# Patient Record
Sex: Female | Born: 2005 | Hispanic: Yes | Smoking: Never smoker
Health system: Southern US, Community
[De-identification: ages and names within clinical notes are randomized; demographics above are authoritative.]

## PROBLEM LIST (undated history)

## (undated) DIAGNOSIS — B079 Viral wart, unspecified: Secondary | ICD-10-CM

## (undated) DIAGNOSIS — K3580 Unspecified acute appendicitis: Secondary | ICD-10-CM

## (undated) DIAGNOSIS — K829 Disease of gallbladder, unspecified: Secondary | ICD-10-CM

## (undated) HISTORY — DX: Unspecified acute appendicitis: K35.80

## (undated) HISTORY — DX: Disease of gallbladder, unspecified: K82.9

## (undated) HISTORY — DX: Viral wart, unspecified: B07.9

---
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
## 2023-02-07 ENCOUNTER — Ambulatory Visit: Payer: Self-pay | Admitting: Family Medicine

## 2023-02-07 ENCOUNTER — Encounter: Payer: Self-pay | Admitting: Family Medicine

## 2023-02-07 VITALS — BP 113/64 | HR 85 | Ht 61.0 in | Wt 134.2 lb

## 2023-02-07 DIAGNOSIS — Z3401 Encounter for supervision of normal first pregnancy, first trimester: Secondary | ICD-10-CM

## 2023-02-07 DIAGNOSIS — Z3009 Encounter for other general counseling and advice on contraception: Secondary | ICD-10-CM

## 2023-02-07 DIAGNOSIS — Z3200 Encounter for pregnancy test, result unknown: Secondary | ICD-10-CM

## 2023-02-07 LAB — PREGNANCY, URINE: Preg Test, Ur: POSITIVE — AB

## 2023-02-07 MED ORDER — PRENATAL VITAMIN 27-0.8 MG PO TABS: 1.0000 | ORAL_TABLET | ORAL | Status: AC

## 2023-02-07 NOTE — Progress Notes (Signed)
Pt was scheduled for FP visit, however she stated that she is pregnant.  UPT positive.  LMP - 11/23/2022.  Positive PT packet given.  The patient was dispensed Prenatal vitamins #100 pills today. I provided counseling today regarding the medication. We discussed the medication, the side effects and when to call clinic. Patient given the opportunity to ask questions. Questions answered.  Pt encouraged to make new OB appointment with [facility] or another provider.  Berdie Ogren, RN

## 2023-03-13 ENCOUNTER — Encounter: Payer: Self-pay | Admitting: Advanced Practice Midwife

## 2023-03-13 ENCOUNTER — Ambulatory Visit: Payer: Medicaid Other | Admitting: Advanced Practice Midwife

## 2023-03-13 VITALS — BP 104/64 | HR 85 | Temp 98.4°F | Wt 133.2 lb

## 2023-03-13 DIAGNOSIS — O0991 Supervision of high risk pregnancy, unspecified, first trimester: Secondary | ICD-10-CM

## 2023-03-13 DIAGNOSIS — O0992 Supervision of high risk pregnancy, unspecified, second trimester: Secondary | ICD-10-CM | POA: Diagnosis not present

## 2023-03-13 DIAGNOSIS — Z3480 Encounter for supervision of other normal pregnancy, unspecified trimester: Secondary | ICD-10-CM | POA: Insufficient documentation

## 2023-03-13 DIAGNOSIS — Z3400 Encounter for supervision of normal first pregnancy, unspecified trimester: Secondary | ICD-10-CM | POA: Insufficient documentation

## 2023-03-13 DIAGNOSIS — Z23 Encounter for immunization: Secondary | ICD-10-CM

## 2023-03-13 DIAGNOSIS — O0932 Supervision of pregnancy with insufficient antenatal care, second trimester: Secondary | ICD-10-CM

## 2023-03-13 DIAGNOSIS — O0930 Supervision of pregnancy with insufficient antenatal care, unspecified trimester: Secondary | ICD-10-CM | POA: Insufficient documentation

## 2023-03-13 DIAGNOSIS — Z634 Disappearance and death of family member: Secondary | ICD-10-CM

## 2023-03-13 DIAGNOSIS — B3731 Acute candidiasis of vulva and vagina: Secondary | ICD-10-CM

## 2023-03-13 HISTORY — DX: Supervision of pregnancy with insufficient antenatal care, unspecified trimester: O09.30

## 2023-03-13 HISTORY — DX: Encounter for supervision of normal first pregnancy, unspecified trimester: Z34.00

## 2023-03-13 LAB — WET PREP FOR TRICH, YEAST, CLUE: Trichomonas Exam: NEGATIVE

## 2023-03-13 LAB — HEMOGLOBIN, FINGERSTICK: Hemoglobin: 11.5 g/dL (ref 11.1–15.9)

## 2023-03-13 MED ORDER — ASPIRIN 81 MG PO TBEC: 81.0000 mg | DELAYED_RELEASE_TABLET | Freq: Every day | ORAL | 12 refills | Status: DC

## 2023-03-13 MED ORDER — ASPIRIN 81 MG PO TBEC: 81.0000 mg | DELAYED_RELEASE_TABLET | Freq: Every day | ORAL | Status: DC

## 2023-03-13 MED ORDER — CLOTRIMAZOLE 1 % VA CREA: 1.0000 | TOPICAL_CREAM | Freq: Every day | VAGINAL | Status: AC

## 2023-03-13 MED ORDER — ASPIRIN 81 MG PO TBEC: 81.0000 mg | DELAYED_RELEASE_TABLET | Freq: Every day | ORAL | Status: AC

## 2023-03-13 NOTE — Progress Notes (Unsigned)
[facility] [address], [address] [place] [address] Main phone: [phone]  Initial Prenatal Visit  Subjective:  Deborah Roman is a 18 y.o. G1P0000 at [redacted]w[redacted]d being seen today to start prenatal care at the [facility] Department. She indicates her LMP was 10/29/23. She states she has taken about 5 Plan B pills over the last couple of weeks to couple of months before becoming pregnant. She states she last took a Plan B in October, because that was her first missed period.  Patient reports not being married but being in a relationship with FOB (20 y.o) for almost a year. She indicates they live together with her parents, aunts, uncles, and cousins. She reports she was born in [place] and moved to the [place] 10 years ago and has not had international travel since being in the [place].  The patient indicates she is "Fine. Happy" with this surprise pregnancy and FOB is "fine as well." She states the FOB has no other children. The patient does not work and has an 11th grade education, not currently in school. She states the FOB works in remodeling. The patient indicates never having used tobacco products, vaping, using MJ or any other illegal drug or substance, and no alcohol ever.  The patient states she has never been physically or sexually abused. She has never experiences IPV per her report. Patient also denies current or history of SI or self harm.  The patient reports no personal or family history of chromosomal abnormalities including no trisomies and no congenital abnormalities; however, the patient does indicate she had a brother who was older than her that died when he was 20 y.o. from "anemia and blood in his urine." She states the she also almost died at 18 years old, but she has no information as to why. She states her sister died when she was 1 for "5 minutes" but came back. Again, patient has no further information as to  why.  She also indicates no personal or family history of HTN, lupus, asthma, MS< IBS, depression, anxiety, or PTSD. Patient does report mother and maternal grandmother have DM. Also reports her father had a history of drug and alcohol use disorder.   She indicates her last dental exam she believes was last year at [facility].  Patient has never had a PAP d/t age guidelines.   EPDS in office today=2. Not consistent with depression and aligns with patient's reported history.   She is currently monitored for the following issues for this high-risk pregnancy:   Patient Active Problem List   Diagnosis Date Noted   Supervision of high risk pregnancy in second trimester 03/13/2023   Late prenatal care 03/13/2023   Vaginal candidiasis 03/13/2023   Appendicitis 06/30/2015   Appendicitis, acute    Patient reports nausea.  Contractions: Not present. Vag. Bleeding: None.  Movement: Absent. Denies leaking of fluid.   Nausea: last occurred yesterday and has occurred as much as three times per day. She does indicate that she is able to eat all day and usually has soup, eggs, beans, and drinks milk. She endorses drinking 1 bottle of water per day, makes smoothies out of fruit, and limits her caffeine intake.   Indications for ASA therapy  One of the following: Previous pregnancy with preeclampsia, especially early onset and with an adverse outcome No  Multifetal gestation No  Chronic hypertension No  Type 1 or 2 diabetes mellitus No  Chronic kidney disease No  Autoimmune disease (antiphospholipid syndrome, systemic lupus erythematosus) No   Two or more of the following: Nulliparity  Yes  Obesity (body mass index >30 kg/m2) No  Family history of preeclampsia in mother or sister No  Age >=35 years No  Sociodemographic characteristics (African American race, low socioeconomic level) Yes  Personal risk factors (eg, previous pregnancy with low birth weight or small for gestational age infant,  previous adverse pregnancy outcome [eg, stillbirth], interval >10 years between pregnancies) No   The following portions of the patient's history were reviewed and updated as appropriate: allergies, current medications, past family history, past medical history, past social history, past surgical history and problem list. Problem list updated.  Objective:   Vitals:   03/13/23 0952  BP: 104/64  Pulse: 85  Temp: 98.4 F (36.9 C)  Weight: 133 lb 3.2 oz (60.4 kg)   Fetal Status: Fetal Heart Rate (bpm): 148 Fundal Height: 16 cm Movement: Absent  Presentation: Undeterminable   Physical Exam Vitals and nursing note reviewed. Exam conducted with a chaperone present Elease Hashimoto, RN present).  Constitutional:      General: She is awake. She is not in acute distress.    Appearance: Normal appearance. She is well-developed and normal weight. She is not ill-appearing.  HENT:     Head: Normocephalic and atraumatic.     Salivary Glands: Right salivary gland is not diffusely enlarged or tender. Left salivary gland is not diffusely enlarged or tender.     Right Ear: External ear normal.     Left Ear: Tympanic membrane and external ear normal.     Ears:     Comments: Some cerumen present in outer ear canal. Patient denies difficulty hearing.      Nose: Nose normal. No congestion or rhinorrhea.     Mouth/Throat:     Lips: Pink.     Mouth: Mucous membranes are moist.     Dentition: Normal dentition. No dental caries.     Pharynx: Oropharynx is clear. Uvula midline. No oropharyngeal exudate or posterior oropharyngeal erythema.     Tonsils: No tonsillar exudate.     Comments: Dentition: Good. Last exam 1 year ago.  Eyes:     General: No scleral icterus.       Right eye: No discharge.        Left eye: No discharge.     Conjunctiva/sclera: Conjunctivae normal.     Pupils: Pupils are equal, round, and reactive to light.     Comments: Red light reflex present bilaterally.  Neck:     Thyroid: No  thyroid mass, thyromegaly or thyroid tenderness.     Trachea: Trachea and phonation normal. No tracheal tenderness or tracheal deviation.  Cardiovascular:     Rate and Rhythm: Normal rate and regular rhythm.     Pulses: Normal pulses.     Heart sounds: Normal heart sounds, S1 normal and S2 normal. No murmur heard.    No friction rub. No gallop.     Comments: Extremities are warm and well perfused Pulmonary:     Effort: Pulmonary effort is normal. No respiratory distress.     Breath sounds: Normal breath sounds and air entry.  Chest:     Chest wall: No mass.  Breasts:    Tanner Score is 5.     Breasts are symmetrical.     Right: Normal. No swelling, bleeding, inverted nipple, mass, nipple discharge, skin change or tenderness.     Left: Normal. No  swelling, bleeding, inverted nipple, mass, nipple discharge, skin change or tenderness.  Abdominal:     General: Abdomen is flat. Bowel sounds are normal. There is no distension.     Palpations: Abdomen is soft.     Tenderness: There is no abdominal tenderness. There is no right CVA tenderness, left CVA tenderness, guarding or rebound.     Comments: Gravid. Fundal height consistent with [redacted] weeks gestation on bimanual exam.   Genitourinary:    General: Normal vulva.     Exam position: Lithotomy position.     Pubic Area: No rash or pubic lice.      Tanner stage (genital): 5.     Labia:        Right: No rash, tenderness, lesion or injury.        Left: No rash, tenderness, lesion or injury.      Urethra: No prolapse, urethral pain, urethral swelling or urethral lesion.     Vagina: No signs of injury and foreign body. Vaginal discharge present. No erythema, tenderness, bleeding, lesions or prolapsed vaginal walls.     Cervix: Discharge present. No cervical motion tenderness, friability, lesion, erythema, cervical bleeding or eversion.     Uterus: Normal. Enlarged (Gravid size consistent with [redacted] weeks gestation). Not tender.      Adnexa: Right  adnexa normal and left adnexa normal.       Right: No tenderness.         Left: No tenderness.    Musculoskeletal:     Right lower leg: No edema.     Left lower leg: No edema.  Lymphadenopathy:     Head:     Right side of head: No submental, submandibular, tonsillar, preauricular or posterior auricular adenopathy.     Left side of head: No submental, submandibular, tonsillar, preauricular or posterior auricular adenopathy.     Cervical: No cervical adenopathy.     Right cervical: No superficial or posterior cervical adenopathy.    Left cervical: No superficial or posterior cervical adenopathy.     Upper Body:     Right upper body: No supraclavicular or axillary adenopathy.     Left upper body: No supraclavicular or axillary adenopathy.     Lower Body: No right inguinal adenopathy. No left inguinal adenopathy.  Skin:    General: Skin is warm and dry.     Capillary Refill: Capillary refill takes less than 2 seconds.     Findings: No bruising or lesion.          Comments: Skin tone appropriate for ethnicity.  Minimal amount of mixed eruption pustular open comodones to middle upper back.  Neurological:     Mental Status: She is alert and oriented to person, place, and time.  Psychiatric:        Attention and Perception: Attention normal.        Mood and Affect: Mood and affect normal.        Speech: Speech normal.        Behavior: Behavior normal. Behavior is cooperative.        Thought Content: Thought content normal.        Cognition and Memory: Cognition normal.    Assessment and Plan:  Pregnancy: G1P0000 at [redacted]w[redacted]d  1. Supervision of high risk pregnancy in second trimester (Primary) Genetics counseling referral paper completed d/t patient report of death of sibling as a child related to possible health conditions. Form completed by provider and given to Rubin Payor, RN to send to [facility].  Anatomy US referral form completed and given to [facility], RN on 03/13/23.  Discussed Plan B  with patient. Determined she had taken 5 over a span of a couple of weeks to a couple of months. Patient not a great historian due to inability to remember.   The patient was dispensed PNV today. RN provided counseling today regarding the medication. Discussed the medication, the side effects and when to call clinic. Patient given the opportunity to ask questions. Questions answered.   The patient was dispensed ASA today. I provided counseling today regarding the medication. We discussed the medication, the side effects and when to call clinic. Patient given the opportunity to ask questions. Questions answered.   Total Weight Gain: 1 lb 3.2 oz (0.544 kg) Pre-gravid BMII=24.95 (Normal). Expected weight gain this pregnancy 25-35 pounds. Discussed diet and exercise with patient.   List of medications safe in pregnancy provided in new OB packet. Educated on medications safe for nausea d/t patient current concern of nausea.   Patient provided list of dental providers in area and encouraged to schedule an appointment for an exam and cleaning. Explained the importance and connection of dental health to overall physical health and healthy pregnancy.   Patient scored 2 on EPDS today. She denies history of SI, self-harm, anxiety or depression, and endorses support at home through FOB and her mother. Patient given Kathreen Cosier, LCSW card and educated on services provided. Patient declined referral at this time.   - MaterniT 21 plus Core, Blood - Prenatal Profile I - QuantiFERON-TB Gold Plus - Hgb Fractionation Cascade - AFP, Serum, Open Spina Bifida - Lead, blood (adult age 22 yrs or greater) - ToxAssure Flex 15, Ur - WET PREP FOR TRICH, YEAST, CLUE - Hemoglobin, fingerstick - aspirin EC 81 MG tablet; Take 1 tablet (81 mg total) by mouth daily. Swallow whole.  Discussed overview of care and coordination with inpatient delivery practices including [facility],  [facility].    Preterm labor symptoms and general obstetric precautions including but not limited to vaginal bleeding, contractions, leaking of fluid and fetal movement were reviewed in detail with the patient.  Please refer to After Visit Summary for other counseling recommendations.   Return in about 4 weeks (around 04/10/2023) for routine prenatal care.  Future Appointments  Date Time Provider Department Center  04/10/2023  8:20 AM [facility] PROVIDER [facility] None   Due to language barrier, a Spanish interpreter Revonda Standard, [place] [id number] ) was present by phone during the history-taking, subsequent discussion, and physical exam with this patient.   Edmonia James, NP

## 2023-03-13 NOTE — Progress Notes (Addendum)
 Presents for initiation of prenatal care and denies medical care thus far in pregnancy. Client was born in [place] and has been in the [place]  [place] ~ [place] years with no international travel. Per client, currently going through immigration and has a Psychologist, forensic. Refer to OB charting - Menstrual History for LMP information. Reports given prenatal vitamins at [facility] when had positive UPT, but quit taking them when mom told her to stop and begin daily folic acid. Counseled to stop daily folic acid and resume prenatal vitamin as contain folic acid, iron and other vitamins / minerals needed by her and the baby during pregnancy. Desires MaterniT-21 and AFP testing today. Aware if dating US  changed EGA, AFP may need to be repeated. Ariel Begun, RN 4 week [facility] appt scheduled and reminder card given. Hgb = 11.5 and no interventions required per standing order. Treated for moderate yeast. Ariel Begun, RN [facility] [place]  referral faxed with snapshot pages and fax confirmation received. Ariel Begun, RN

## 2023-03-17 LAB — TOXASSURE FLEX 15, UR
6-ACETYLMORPHINE IA: NEGATIVE ng/mL
7-aminoclonazepam: NOT DETECTED ng/mg{creat}
AMPHETAMINES IA: NEGATIVE ng/mL
Alpha-hydroxyalprazolam: NOT DETECTED ng/mg{creat}
Alpha-hydroxymidazolam: NOT DETECTED ng/mg{creat}
Alpha-hydroxytriazolam: NOT DETECTED ng/mg{creat}
Alprazolam: NOT DETECTED ng/mg{creat}
BARBITURATES IA: NEGATIVE ng/mL
BUPRENORPHINE: NEGATIVE
Benzodiazepines: NEGATIVE
Buprenorphine: NOT DETECTED ng/mg{creat}
CANNABINOIDS IA: NEGATIVE ng/mL
COCAINE METABOLITE IA: NEGATIVE ng/mL
Clonazepam: NOT DETECTED ng/mg{creat}
Creatinine: 231 mg/dL
Desalkylflurazepam: NOT DETECTED ng/mg{creat}
Desmethyldiazepam: NOT DETECTED ng/mg{creat}
Desmethylflunitrazepam: NOT DETECTED ng/mg{creat}
Diazepam: NOT DETECTED ng/mg{creat}
ETHYL ALCOHOL Enzymatic: NEGATIVE g/dL
FENTANYL: NEGATIVE
Fentanyl: NOT DETECTED ng/mg{creat}
Flunitrazepam: NOT DETECTED ng/mg{creat}
Lorazepam: NOT DETECTED ng/mg{creat}
METHADONE IA: NEGATIVE ng/mL
METHADONE MTB IA: NEGATIVE ng/mL
Midazolam: NOT DETECTED ng/mg{creat}
Norbuprenorphine: NOT DETECTED ng/mg{creat}
Norfentanyl: NOT DETECTED ng/mg{creat}
OPIATE CLASS IA: NEGATIVE ng/mL
OXYCODONE CLASS IA: NEGATIVE ng/mL
Oxazepam: NOT DETECTED ng/mg{creat}
PHENCYCLIDINE IA: NEGATIVE ng/mL
TAPENTADOL, IA: NEGATIVE ng/mL
TRAMADOL IA: NEGATIVE ng/mL
Temazepam: NOT DETECTED ng/mg{creat}

## 2023-03-18 LAB — MATERNIT 21 PLUS CORE, BLOOD
Fetal Fraction: 26
Result (T21): NEGATIVE
Trisomy 13 (Patau syndrome): NEGATIVE
Trisomy 18 (Edwards syndrome): NEGATIVE
Trisomy 21 (Down syndrome): NEGATIVE

## 2023-03-18 LAB — QUANTIFERON-TB GOLD PLUS
QuantiFERON Mitogen Value: >10 [IU]/mL
QuantiFERON Nil Value: 0.04 [IU]/mL
QuantiFERON TB1 Ag Value: 0.04 [IU]/mL
QuantiFERON TB2 Ag Value: 0.05 [IU]/mL
QuantiFERON-TB Gold Plus: NEGATIVE

## 2023-03-18 LAB — PREGNANCY, INITIAL SCREEN
Antibody Screen: NEGATIVE
Basophils Absolute: 0 10*3/uL (ref 0.0–0.2)
Basos: 0 %
Bilirubin, UA: NEGATIVE
Chlamydia trachomatis, NAA: NEGATIVE
EOS (ABSOLUTE): 0.1 10*3/uL (ref 0.0–0.4)
Eos: 1 %
Glucose, UA: NEGATIVE
HCV Ab: NONREACTIVE
HIV Screen 4th Generation wRfx: NONREACTIVE
Hematocrit: 35.4 % (ref 34.0–46.6)
Hemoglobin: 11.7 g/dL (ref 11.1–15.9)
Hepatitis B Surface Ag: NEGATIVE
Immature Grans (Abs): 0 10*3/uL (ref 0.0–0.1)
Immature Granulocytes: 0 %
Ketones, UA: NEGATIVE
Lymphocytes Absolute: 2.2 10*3/uL (ref 0.7–3.1)
Lymphs: 25 %
MCH: 29.7 pg (ref 26.6–33.0)
MCHC: 33.1 g/dL (ref 31.5–35.7)
MCV: 90 fL (ref 79–97)
Monocytes Absolute: 0.3 10*3/uL (ref 0.1–0.9)
Monocytes: 4 %
Neisseria Gonorrhoeae by PCR: NEGATIVE
Neutrophils Absolute: 5.9 10*3/uL (ref 1.4–7.0)
Neutrophils: 70 %
Nitrite, UA: NEGATIVE
Platelets: 224 10*3/uL (ref 150–450)
RBC, UA: NEGATIVE
RBC: 3.94 x10E6/uL (ref 3.77–5.28)
RDW: 13.6 % (ref 11.7–15.4)
RPR Ser Ql: NONREACTIVE
Rh Factor: POSITIVE
Rubella Antibodies, IGG: 3.27 {index} (ref >0.99)
Specific Gravity, UA: >=1.03 — AB (ref 1.005–1.030)
Urobilinogen, Ur: 0.2 mg/dL (ref 0.2–1.0)
WBC: 8.5 10*3/uL (ref 3.4–10.8)
pH, UA: 5.5 (ref 5.0–7.5)

## 2023-03-18 LAB — MICROSCOPIC EXAMINATION
Casts: NONE SEEN /[LPF]
Epithelial Cells (non renal): >10 /[HPF] — AB (ref 0–10)
RBC, Urine: NONE SEEN /[HPF] (ref 0–2)

## 2023-03-18 LAB — HGB FRACTIONATION CASCADE
Hgb A2: 2.8 % (ref 1.8–3.2)
Hgb A: 97.2 % (ref 96.4–98.8)
Hgb F: 0 % (ref 0.0–2.0)
Hgb S: 0 %

## 2023-03-18 LAB — AFP, SERUM, OPEN SPINA BIFIDA
AFP MoM: 0.32
AFP Value: 17.3 ng/mL
Gest. Age on Collection Date: 19.2 wk
Maternal Age At EDD: 19.5 a
OSBR Risk 1 IN: 10000
Test Results:: NEGATIVE
Weight: 133 [lb_av]

## 2023-03-18 LAB — URINE CULTURE, OB REFLEX

## 2023-03-18 LAB — HCV INTERPRETATION

## 2023-03-18 LAB — LEAD, BLOOD (ADULT >= 16 YRS): Lead-Whole Blood: <1 ug/dL (ref 0.0–3.4)

## 2023-03-20 ENCOUNTER — Encounter: Payer: Self-pay | Admitting: Family Medicine

## 2023-03-20 DIAGNOSIS — B951 Streptococcus, group B, as the cause of diseases classified elsewhere: Secondary | ICD-10-CM

## 2023-03-20 DIAGNOSIS — Z634 Disappearance and death of family member: Secondary | ICD-10-CM | POA: Insufficient documentation

## 2023-03-20 HISTORY — DX: Streptococcus, group b, as the cause of diseases classified elsewhere: B95.1

## 2023-03-22 ENCOUNTER — Telehealth: Payer: Self-pay

## 2023-03-22 ENCOUNTER — Ambulatory Visit: Payer: Medicaid Other | Admitting: Nurse Practitioner

## 2023-03-22 ENCOUNTER — Encounter: Payer: Self-pay | Admitting: Nurse Practitioner

## 2023-03-22 VITALS — BP 87/46 | HR 72 | Temp 99.3°F | Wt 136.0 lb

## 2023-03-22 DIAGNOSIS — O2342 Unspecified infection of urinary tract in pregnancy, second trimester: Secondary | ICD-10-CM

## 2023-03-22 DIAGNOSIS — O0992 Supervision of high risk pregnancy, unspecified, second trimester: Secondary | ICD-10-CM

## 2023-03-22 DIAGNOSIS — O2340 Unspecified infection of urinary tract in pregnancy, unspecified trimester: Secondary | ICD-10-CM | POA: Insufficient documentation

## 2023-03-22 HISTORY — DX: Unspecified infection of urinary tract in pregnancy, unspecified trimester: O23.40

## 2023-03-22 MED ORDER — CEFTRIAXONE SODIUM 500 MG IJ SOLR
500.0000 mg | Freq: Once | INTRAMUSCULAR | Status: AC
  Administered 2023-03-22: 500 mg via INTRAMUSCULAR

## 2023-03-22 NOTE — Progress Notes (Signed)
  [facility] [address], [address] [place] [address] Main phone: [phone]  Prenatal Visit  Subjective:  Deborah Roman is a 18 y.o. G1P0000 at [redacted]w[redacted]d being seen today for ongoing prenatal care.  She is currently monitored for the following issues for this low-risk pregnancy:   Patient Active Problem List   Diagnosis Date Noted   UTI (urinary tract infection) during pregnancy GSB + in urine culture (25,000-50,000) on 03/30/23 04/08/2023   Sibling deceased, Brother died at age 68 for unknown reason 03/20/2023   Positive GBS test 03/20/2023   Supervision of high risk pregnancy in second trimester March 30, 2023   Late prenatal care 16 weeks 30-Mar-2023   Appendicitis 06/30/2015   Appendicitis, acute    Patient reports  round ligament pain .  Contractions: Not present. Vag. Bleeding: None.  Movement: Present. Denies leaking of fluid/ROM.   The following portions of the patient's history were reviewed and updated as appropriate: allergies, current medications, past family history, past medical history, past social history, past surgical history and problem list. Problem list updated.  Objective:   Vitals:   2023-04-08 1516  BP: (!) 87/46  Pulse: 72  Temp: 99.3 F (37.4 C)  Weight: 136 lb (61.7 kg)    Fetal Status: Fetal Heart Rate (bpm): 140 Fundal Height: 18 cm Movement: Present     General:  Alert, oriented and cooperative. Patient is in no acute distress.  Skin: Skin is warm and dry. No rash noted.   Cardiovascular: Normal heart rate noted  Respiratory: Normal respiratory effort, no problems with respiration noted  Abdomen: Soft, gravid, appropriate for gestational age.  Pain/Pressure: Present     Pelvic: Cervical exam deferred        Extremities: Normal range of motion.  Edema: None  Mental Status: Normal mood and affect. Normal behavior. Normal judgment and thought content.   Assessment and Plan:   Pregnancy: G1P0000 at [redacted]w[redacted]d  1. Urinary tract infection in mother during second trimester of pregnancy (Primary) Educated pt on TOC in 1 month - cefTRIAXone (ROCEPHIN) injection 500 mg  2. Supervision of high risk pregnancy in second trimester Reviewed NOB labs with pt including gender. Pt seemed a little shocked to learn female gender fetus. Low BP today. Pt endorses eating and drinking. She reports feeling a little lightheaded yesterday. Educated pt to eat and drink water frequently and to rise slowly, sit back down if she feels dizzy. Asked pt to present to L&D if she ever faints or falls. Fundus was U-2 (smaller than dates). Will confirm EDD at anatomy US scheduled on 04/22/23    Preterm labor symptoms and general obstetric precautions including but not limited to vaginal bleeding, contractions, leaking of fluid and fetal movement were reviewed in detail with the patient. Please refer to After Visit Summary for other counseling recommendations.   Due to language barrier, a Spanish interpreter Rachel Bo T.) was present in person during the history-taking, subsequent discussion, and physical exam with this patient, but patient understands and speaks [place] well. Interpreter interpreted as needed.    Return in about 19 days (around 04/10/2023).  Future Appointments  Date Time Provider Department Center  04/10/2023  8:20 AM AC-MH PROVIDER AC-MAT None    Alicia Amel, NP

## 2023-03-22 NOTE — Progress Notes (Signed)
UA +1 Leukocytes Urine Culture=25,000-50,000 Group B Strep (+) and susceptible to Ceftriaxone. Will treat with 500mg  IM Ceftriaxone. Confirmed treatment with Dr. Fayette Pho (Supervising physician). UDS (-) Lead (WNL) WetPrep (-) Hgb=11.5 (WNL) MaterniT21 (-) TB Gold (-) Hgb Fractionation Cascade (Normal), no variant of beta thalassemia identified AFP (-) Hep B (-) HCV (NR) RPR (NR) Rubella (immune) Blood type A + Antibodies (-) HIV (NR) Chlamydia (-) Gonorrhea (-) CBC (WNL)

## 2023-03-22 NOTE — Telephone Encounter (Signed)
Call to client with Deborah Roman per Francia Greaves FNP as client needs Ceftriazone injection for GBS UTI today. Call to client and left message to call ASAP regarding lab result and need for treatment today if possible. Number to call provided. Call to emergency contact (father) and requested he assist [place] in contacting client to call [facility] regarding above and Mr. Ethelene Hal stated okay. Jossie Ng, RN

## 2023-03-22 NOTE — Progress Notes (Addendum)
Presents for UTI treatment today. Aware of next [facility] RV appt on 04/09/22 at 0800. [facility]ified has [facility] contact card and aware of 04/22/23 [facility] [place] appt at [facility]. Counseled [facility] genetic counselor referral faxed today and will receive a phone call from the scheduler with appt. Jossie Ng, RN Client administered Ceftriazone as ordered and tolerated with minimal complaint. Counseled to remain in room  for 15 minutes post injection and client complied with request. Jossie Ng, RN

## 2023-03-22 NOTE — Telephone Encounter (Signed)
Patient agreed to come to clinic today for UTI treatment. BTHIELE RN

## 2023-04-10 ENCOUNTER — Encounter: Payer: Self-pay | Admitting: Physician Assistant

## 2023-04-10 ENCOUNTER — Ambulatory Visit: Payer: Self-pay | Admitting: Physician Assistant

## 2023-04-10 VITALS — BP 91/54 | HR 80 | Temp 97.9°F | Wt 139.6 lb

## 2023-04-10 DIAGNOSIS — O0930 Supervision of pregnancy with insufficient antenatal care, unspecified trimester: Secondary | ICD-10-CM

## 2023-04-10 DIAGNOSIS — O2342 Unspecified infection of urinary tract in pregnancy, second trimester: Secondary | ICD-10-CM

## 2023-04-10 DIAGNOSIS — O0932 Supervision of pregnancy with insufficient antenatal care, second trimester: Secondary | ICD-10-CM

## 2023-04-10 DIAGNOSIS — Z3A23 23 weeks gestation of pregnancy: Secondary | ICD-10-CM

## 2023-04-10 DIAGNOSIS — O0992 Supervision of high risk pregnancy, unspecified, second trimester: Secondary | ICD-10-CM

## 2023-04-10 NOTE — Progress Notes (Signed)
  [facility] [address] [place] [address] Main phone: [phone]  Prenatal Visit  Subjective:  Kylin Genna is a 18 y.o. G1P0000 at [redacted]w[redacted]d being seen today for ongoing prenatal care.  She is currently monitored for the following issues for this low-risk pregnancy:   Patient Active Problem List   Diagnosis Date Noted   UTI (urinary tract infection) during pregnancy GSB + in urine culture (25,000-50,000) on April 04, 2023 13-Apr-2023   Sibling deceased, Brother died at age 40 for unknown reason 03/20/2023   Positive GBS test 03/20/2023   Supervision of high risk pregnancy in second trimester 04-04-2023   Late prenatal care 16 weeks 2023-04-04   Appendicitis 06/30/2015   Appendicitis, acute    Patient reports no complaints.  Contractions: Not present. Vag. Bleeding: None.  Movement: Present. Denies leaking of fluid/ROM.   The following portions of the patient's history were reviewed and updated as appropriate: allergies, current medications, past family history, past medical history, past social history, past surgical history and problem list. Problem list updated.  Objective:   Vitals:   04/10/23 0835  BP: (!) 91/54  Pulse: 80  Temp: 97.9 F (36.6 C)  Weight: 139 lb 9.6 oz (63.3 kg)    Fetal Status: Fetal Heart Rate (bpm): 136 Fundal Height: 20 cm Movement: Present     General:  Alert, oriented and cooperative. Patient is in no acute distress.  Skin: Skin is warm and dry. No rash noted.   Cardiovascular: Normal heart rate noted  Respiratory: Normal respiratory effort, no problems with respiration noted  Abdomen: Soft, gravid, appropriate for gestational age.  Pain/Pressure: Absent     Pelvic: Cervical exam deferred        Extremities: Normal range of motion.  Edema: None   Mental Status: Normal mood and affect. Normal behavior. Normal judgment and thought content.   Assessment and Plan:   Pregnancy: G1P0000 at [redacted]w[redacted]d  1. [redacted] weeks gestation of pregnancy (Primary) RV in 4 weeks. Anticipatory guidance re: routine 1h glucose tolerance test for GDM screening at that visit.  2. Supervision of high risk pregnancy in second trimester Enc to keep fetal anatomy screen as sched on 04/22/23. Size < dates today. Pt states she is confident in LMP of 10/29/22. [place] will help establish EDC. Continue PNV and daily low-dose aspirin.  3. Urinary tract infection in mother during second trimester of pregnancy Denies UTI sx or problems after antibiotic injection. TOC via U C&S today. - Urine Culture   Preterm labor symptoms and general obstetric precautions including but not limited to vaginal bleeding, contractions, leaking of fluid and fetal movement were reviewed in detail with the patient. Please refer to After Visit Summary for other counseling recommendations.  Due to language barrier, a Spanish interpreter Rachel Bo T.) was present in person during the history-taking, subsequent discussion, and physical exam with this patient.   Return in about 4 weeks (around 05/08/2023) for Routine prenatal care, 28 wk labs (before 10:30am or 3pm).  No future appointments.  Landry Dyke, PA-C

## 2023-04-10 NOTE — Progress Notes (Signed)
Verified has [facility] contact card. Kept 03/26/23 [facility] genetic counseling appt and aware of 04/22/23 [facility] [place] appt. Urine TOC today Remains undecided as to pediatrician and verified has resource list previously given. Jossie Ng, RN

## 2023-04-12 LAB — URINE CULTURE: Organism ID, Bacteria: NO GROWTH

## 2023-05-01 ENCOUNTER — Encounter: Payer: Self-pay | Admitting: Family Medicine

## 2023-05-07 NOTE — Progress Notes (Unsigned)
  [facility] [address] [place] [address] Main phone: [phone]  Prenatal Visit  Subjective:  Deborah Roman is a 18 y.o. G1P0000 at [redacted]w[redacted]d being seen today for ongoing prenatal care.  She is currently monitored for the following issues for this high-risk pregnancy:   Patient Active Problem List   Diagnosis Date Noted   UTI (urinary tract infection) during pregnancy GSB + in urine culture (25,000-50,000) on 03/14/2023 March 23, 2023   Sibling deceased, Brother died at age 67 for unknown reason 03/20/2023   Positive GBS test 03/20/2023   Supervision of high risk pregnancy in second trimester 2023-03-14   Late prenatal care 16 weeks 03-14-23   Patient reports {sx:14538}.   .  .   . Denies leaking of fluid/ROM.   The following portions of the patient's history were reviewed and updated as appropriate: allergies, current medications, past family history, past medical history, past social history, past surgical history and problem list. Problem list updated.  Objective:  There were no vitals filed for this visit.  Fetal Status:           General:  Alert, oriented and cooperative. Patient is in no acute distress.  Skin: Skin is warm and dry. No rash noted.   Cardiovascular: Normal heart rate noted  Respiratory: Normal respiratory effort, no problems with respiration noted  Abdomen: Soft, gravid, appropriate for gestational age.        Pelvic: {Blank single:19197::"Cervical exam performed","Cervical exam deferred"}        Extremities: Normal range of motion.     Mental Status: Normal mood and affect. Normal behavior. Normal judgment and thought content.   Assessment and Plan:  Pregnancy: G1P0000 at [redacted]w[redacted]d  1. [redacted] weeks gestation of pregnancy [place] completed on 04/22/23  2. Supervision of high risk pregnancy in second trimester (Primary) -taking PNV and ASA daily? Twg exercise  3. Urinary tract  infection in mother during second trimester of pregnancy -TOC negative on 04/10/23   Preterm labor symptoms and general obstetric precautions including but not limited to vaginal bleeding, contractions, leaking of fluid and fetal movement were reviewed in detail with the patient. Please refer to After Visit Summary for other counseling recommendations.  No follow-ups on file.  Future Appointments  Date Time Provider Department Center  05/08/2023  9:00 AM AC-MH PROVIDER AC-MAT None  Due to language barrier, a Spanish interpreter ({achd interps:32101}) was present {achd interp options:32100::"in person"} during the history-taking, subsequent discussion, and physical exam with this patient.   Lenice Llamas, [place]

## 2023-05-08 ENCOUNTER — Encounter: Payer: Self-pay | Admitting: Family Medicine

## 2023-05-08 ENCOUNTER — Ambulatory Visit: Payer: Self-pay | Admitting: Family Medicine

## 2023-05-08 VITALS — BP 95/61 | HR 76 | Temp 97.4°F | Wt 147.2 lb

## 2023-05-08 DIAGNOSIS — Z3A20 20 weeks gestation of pregnancy: Secondary | ICD-10-CM

## 2023-05-08 DIAGNOSIS — O0992 Supervision of high risk pregnancy, unspecified, second trimester: Secondary | ICD-10-CM

## 2023-05-08 DIAGNOSIS — O2342 Unspecified infection of urinary tract in pregnancy, second trimester: Secondary | ICD-10-CM

## 2023-05-08 DIAGNOSIS — Z634 Disappearance and death of family member: Secondary | ICD-10-CM

## 2023-05-08 MED ORDER — MEFLOQUINE HCL 250 MG PO TABS: 250.0000 mg | ORAL_TABLET | ORAL | 0 refills | Status: AC

## 2023-05-08 MED ORDER — MEFLOQUINE HCL 250 MG PO TABS: 250.0000 mg | ORAL_TABLET | ORAL | 0 refills | Status: DC

## 2023-05-08 NOTE — Addendum Note (Signed)
 Addended by: Lenice Llamas on: 05/08/2023 02:30 PM   Modules accepted: Orders

## 2023-05-08 NOTE — Progress Notes (Signed)
 Here today for 20.5 week MH RV. Taking PNV and ASA every day. Kept 04/22/23 [facility] [place]. Denies Ed/hospital visits since last RV. Tawny Hopping, RN

## 2023-05-08 NOTE — Addendum Note (Signed)
 Addended by: Lenice Llamas on: 05/08/2023 12:16 PM   Modules accepted: Orders

## 2023-05-27 ENCOUNTER — Ambulatory Visit: Admitting: Family Medicine

## 2023-05-27 VITALS — BP 100/55 | HR 77 | Wt 149.6 lb

## 2023-05-27 DIAGNOSIS — O0992 Supervision of high risk pregnancy, unspecified, second trimester: Secondary | ICD-10-CM

## 2023-05-27 DIAGNOSIS — Z3A23 23 weeks gestation of pregnancy: Secondary | ICD-10-CM

## 2023-05-27 DIAGNOSIS — Z3480 Encounter for supervision of other normal pregnancy, unspecified trimester: Secondary | ICD-10-CM

## 2023-05-27 DIAGNOSIS — Z3482 Encounter for supervision of other normal pregnancy, second trimester: Secondary | ICD-10-CM

## 2023-05-27 NOTE — Progress Notes (Signed)
 Verified has [facility] contact card. Verified has Librarian, academic List previously given. Given CenteringPregnancy Appointment List. Jossie Ng, RN

## 2023-05-27 NOTE — Progress Notes (Signed)
 Recently diagnosed with Influenza A and reports feeling much better now. States took no meds during flu episode except for Tylenol, but not taking Tylenol now. Verified has pediatrician resource list previously given. Jossie Ng, RN

## 2023-05-29 ENCOUNTER — Encounter: Payer: Self-pay | Admitting: Family Medicine

## 2023-05-29 ENCOUNTER — Ambulatory Visit: Payer: Self-pay

## 2023-05-29 NOTE — Assessment & Plan Note (Addendum)
 Doing well. BP normal at 100/55. TWG 17 lb 9.6 oz (7.983 kg). Taking prenatal vitamin daily and aspirin 81 mg daily. Discussed normal anatomy US results. Next prenatal appointment in 4 weeks .

## 2023-05-29 NOTE — Progress Notes (Signed)
  [facility] [address] [place] [address] Main phone: [phone]  Prenatal Visit  Subjective:  Deborah Roman is a 18 y.o. G1P0000 at [redacted]w[redacted]d being seen today for ongoing prenatal care.  She is currently monitored for the following issues for this low-risk pregnancy:   Patient Active Problem List   Diagnosis Date Noted   Sibling deceased, Brother died at age 76 due to Leukemia 2023-03-25   Positive GBS test 25-Mar-2023   Supervision of other normal pregnancy, antepartum 03/13/2023   Late prenatal care 16 weeks 03/13/2023   Patient reports no complaints.  Contractions: Not present. Vag. Bleeding: None.  Movement: Present. Denies leaking of fluid/ROM.   The following portions of the patient's history were reviewed and updated as appropriate: allergies, current medications, past family history, past medical history, past social history, past surgical history and problem list. Problem list updated.  Objective:   Vitals:   05/27/23 1357  BP: (!) 100/55  Pulse: 77  Weight: 149 lb 9.6 oz (67.9 kg)   Fetal Status: Fetal Heart Rate (bpm): 148 Fundal Height: 21 cm Movement: Present     General:  Alert, oriented and cooperative. Patient is in no acute distress.  Skin: Skin is warm and dry. No rash noted.   Cardiovascular: Normal heart rate noted  Respiratory: Normal respiratory effort, no problems with respiration noted  Abdomen: Soft, gravid, appropriate for gestational age.  Pain/Pressure: Absent     Pelvic: Cervical exam deferred        Extremities: Normal range of motion.     Mental Status: Normal mood and affect. Normal behavior. Normal judgment and thought content.   Assessment and Plan:  Pregnancy: G1P0000 at [redacted]w[redacted]d  [redacted] weeks gestation of pregnancy  Supervision of other normal pregnancy, antepartum Assessment & Plan: Doing well. BP normal at 100/55. TWG 17 lb 9.6 oz (7.983 kg). Taking  prenatal vitamin daily and aspirin 81 mg daily. Discussed normal anatomy US results. Next prenatal appointment in 4 weeks .      Preterm labor symptoms and general obstetric precautions including but not limited to vaginal bleeding, contractions, leaking of fluid and fetal movement were reviewed in detail with the patient. Please refer to After Visit Summary for other counseling recommendations.  No follow-ups on file.  Future Appointments  Date Time Provider Department Center  06/24/2023 12:30 PM AC-MH PROVIDER AC-MAT None  07/23/2023 12:30 PM AC-MH PROVIDER AC-MAT None  08/19/2023 12:30 PM AC-MH PROVIDER AC-MAT None  09/02/2023 12:30 PM AC-MH PROVIDER AC-MAT None  09/16/2023 12:30 PM AC-MH PROVIDER AC-MAT None  09/30/2023 12:30 PM AC-MH PROVIDER AC-MAT None  10/14/2023 12:30 PM AC-MH PROVIDER AC-MAT None  10/29/2023 12:30 PM AC-MH PROVIDER AC-MAT None  11/11/2023 12:30 PM AC-MH PROVIDER AC-MAT None    Clydene Fake, MD

## 2023-06-21 ENCOUNTER — Telehealth: Payer: Self-pay | Admitting: Family Medicine

## 2023-06-24 ENCOUNTER — Ambulatory Visit: Admitting: Family Medicine

## 2023-06-24 VITALS — BP 98/62 | HR 93 | Wt 155.0 lb

## 2023-06-24 DIAGNOSIS — Z3480 Encounter for supervision of other normal pregnancy, unspecified trimester: Secondary | ICD-10-CM

## 2023-06-24 DIAGNOSIS — Z3483 Encounter for supervision of other normal pregnancy, third trimester: Secondary | ICD-10-CM

## 2023-06-24 NOTE — Progress Notes (Unsigned)
 Client presents for CenteringPregnancy Cycle 2, Session 2 and counseled regarding need for Tdap and 28 week labs. Also went for visit to [place] this month and needs Quantiferon TB Gold test. Declined to do today as would miss part of Centering. Elected to come 06/25/23 for Tdap and labs. Appt scheduled in Nurse Clinic and appt reminder card given to client. BP cuff measurement done and = 28 cm. Pregnancy care home forms completed and given to Dr. Tanis Fan for review. Verified has [facility] contact card.

## 2023-06-25 ENCOUNTER — Ambulatory Visit (LOCAL_COMMUNITY_HEALTH_CENTER)

## 2023-06-25 DIAGNOSIS — Z3480 Encounter for supervision of other normal pregnancy, unspecified trimester: Secondary | ICD-10-CM

## 2023-06-25 DIAGNOSIS — Z719 Counseling, unspecified: Secondary | ICD-10-CM

## 2023-06-25 DIAGNOSIS — Z23 Encounter for immunization: Secondary | ICD-10-CM | POA: Diagnosis not present

## 2023-06-25 LAB — HEMOGLOBIN, FINGERSTICK: Hemoglobin: 11.9 g/dL (ref 11.1–15.9)

## 2023-06-25 NOTE — Progress Notes (Signed)
  [facility] [address], [address] [place] [address] Main phone: [phone]  Prenatal Visit  Subjective:  Deborah Roman is a 18 y.o. G1P0000 at [redacted]w[redacted]d being seen today for ongoing prenatal care.  She is currently monitored for the following issues for this low-risk pregnancy:   Patient Active Problem List   Diagnosis Date Noted   Sibling deceased, Brother died at age 64 due to Leukemia 2023-03-31   Positive GBS test March 31, 2023   Supervision of other normal pregnancy, antepartum 03/13/2023   Late prenatal care 16 weeks 03/13/2023   Patient reports  cramping intermittently and fetal kicking of left ribs .  Contractions: Not present. Vag. Bleeding: None.  Movement: Present. Denies leaking of fluid/ROM.   The following portions of the patient's history were reviewed and updated as appropriate: allergies, current medications, past family history, past medical history, past social history, past surgical history and problem list. Problem list updated.  Objective:   Vitals:   06/24/23 1539  BP: 98/62  Pulse: 93  Weight: 155 lb (70.3 kg)   Fetal Status: Fetal Heart Rate (bpm): 136 Fundal Height: 24 cm Movement: Present     General:  Alert, oriented and cooperative. Patient is in no acute distress.  Skin: Skin is warm and dry. No rash noted.   Cardiovascular: Normal heart rate noted  Respiratory: Normal respiratory effort, no problems with respiration noted  Abdomen: Soft, gravid, appropriate for gestational age.  Pain/Pressure: Present (Intermittent cramping, fetal kicking of left ribs)     Pelvic: Cervical exam deferred        Extremities: Normal range of motion.     Mental Status: Normal mood and affect. Normal behavior. Normal judgment and thought content.   Assessment and Plan:  Pregnancy: G1P0000 at [redacted]w[redacted]d  Supervision of other normal pregnancy, antepartum Assessment & Plan: Doing well. BP normal at  98/62. TWG 23 lb (10.4 kg). Taking prenatal vitamin daily and aspirin  81 mg daily. Next prenatal appointment in 2 weeks.   Plan for 28 week labs and pre-eclampsia education at next visit.      Preterm labor symptoms and general obstetric precautions including but not limited to vaginal bleeding, contractions, leaking of fluid and fetal movement were reviewed in detail with the patient. Please refer to After Visit Summary for other counseling recommendations.  No follow-ups on file.  Future Appointments  Date Time Provider Department Center  07/23/2023 12:30 PM AC-MH PROVIDER AC-MAT None  08/19/2023 12:30 PM AC-MH PROVIDER AC-MAT None  09/02/2023 12:30 PM AC-MH PROVIDER AC-MAT None  09/16/2023 12:30 PM AC-MH PROVIDER AC-MAT None  09/30/2023 12:30 PM AC-MH PROVIDER AC-MAT None  10/14/2023 12:30 PM AC-MH PROVIDER AC-MAT None  10/29/2023 12:30 PM AC-MH PROVIDER AC-MAT None  11/11/2023 12:30 PM AC-MH PROVIDER AC-MAT None   Jack Marts, MD

## 2023-06-25 NOTE — Assessment & Plan Note (Signed)
 Doing well. BP normal at 98/62. TWG 23 lb (10.4 kg). Taking prenatal vitamin daily and aspirin  81 mg daily. Next prenatal appointment in 2 weeks.   Plan for 28 week labs and pre-eclampsia education at next visit.

## 2023-06-25 NOTE — Progress Notes (Signed)
 In nurse clinic for 28 week labs, QFT, and Tdap.   Patient drank glucose drink within one minute and finished at 1:37 pm. Lab due at 2:37 pm. Lab notified. Instructions given to patient for 1 hr GTT test.  Labs to be drawn: 1 hr GTT,  HIV, RPR, hgb, QFT.   Tdap given today and tolerated well. Updated [facility] copy given and explained.  Hgb = 11.9 g/dl. WNL. No treatment indicated per SO Dr Bohdan Bush. Patient informed of hgb results.    No interpreter needed. Patient speaks and understands [place].  Next [facility]/centering appt 07/23/23, patient aware. Ell Tiso, RN

## 2023-06-27 LAB — HIV-1/HIV-2 QUALITATIVE RNA
HIV-1 RNA, Qualitative: NONREACTIVE
HIV-2 RNA, Qualitative: NONREACTIVE

## 2023-06-27 NOTE — Progress Notes (Signed)
 Hgb reviewed. Hgb WNL  no treatment indicated per SO Dr Bohdan Bush. Results were reviewed with patient during her visit on 06/25/2023. Verbalizes understanding. Estefana Taylor, RN

## 2023-06-28 LAB — QUANTIFERON-TB GOLD PLUS
QuantiFERON Mitogen Value: 8.66 [IU]/mL
QuantiFERON Nil Value: 0.03 [IU]/mL
QuantiFERON TB1 Ag Value: 0.04 [IU]/mL
QuantiFERON TB2 Ag Value: 0.03 [IU]/mL
QuantiFERON-TB Gold Plus: NEGATIVE

## 2023-06-28 LAB — RPR: RPR Ser Ql: NONREACTIVE

## 2023-06-28 LAB — GLUCOSE, 1 HOUR GESTATIONAL: Gestational Diabetes Screen: 125 mg/dL (ref 70–139)

## 2023-07-19 ENCOUNTER — Telehealth: Payer: Self-pay | Admitting: Family Medicine

## 2023-07-19 NOTE — Progress Notes (Unsigned)
  [facility] [address], [address] [place] [address] Main phone: [phone]  Prenatal Visit  Subjective:  Deborah Roman is a 18 y.o. G1P0000 at [redacted]w[redacted]d being seen today for ongoing prenatal care.  She is currently monitored for the following issues for this low-risk pregnancy:   Patient Active Problem List   Diagnosis Date Noted   Sibling deceased, Brother died at age 73 due to Leukemia 18-Apr-2023   Positive GBS test 04-18-2023   Supervision of other normal pregnancy, antepartum 03/13/2023   Late prenatal care 16 weeks 03/13/2023   Patient reports {sx:14538}.   .  .   . Denies leaking of fluid/ROM.   The following portions of the patient's history were reviewed and updated as appropriate: allergies, current medications, past family history, past medical history, past social history, past surgical history and problem list. Problem list updated.  Objective:   There were no vitals filed for this visit.  Fetal Status:           General:  Alert, oriented and cooperative. Patient is in no acute distress.  Skin: Skin is warm and dry. No rash noted.   Cardiovascular: Normal heart rate noted  Respiratory: Normal respiratory effort, no problems with respiration noted  Abdomen: Soft, gravid, appropriate for gestational age.        Pelvic: Cervical exam deferred        Extremities: Normal range of motion.     Mental Status: Normal mood and affect. Normal behavior. Normal judgment and thought content.   Assessment and Plan:  Pregnancy: G1P0000 at [redacted]w[redacted]d  1. [redacted] weeks gestation of pregnancy (Primary) Signs and symptoms of preeclampsia were verbally reviewed with warning signs, when and how to call. A written handout with tips on how to take your blood pressure, as well as warning signs was provided to the patient.    2. Supervision of other normal pregnancy, antepartum Centering Pregnancy, Session#3: Reviewed  resources in [employer].   Facilitated discussion today:  Stress/Stress reduction and breastfeeding Mindfulness activity completed as well as deep breathing with still touch for childbirth preparation.    Fundal height and FHR appropriate today unless noted otherwise in plan. Patient to continue group care.     Preterm labor symptoms and general obstetric precautions including but not limited to vaginal bleeding, contractions, leaking of fluid and fetal movement were reviewed in detail with the patient. Please refer to After Visit Summary for other counseling recommendations.  No follow-ups on file.  Future Appointments  Date Time Provider Department Center  07/23/2023 12:30 PM AC-MH PROVIDER AC-MAT None  08/19/2023 12:30 PM AC-MH PROVIDER AC-MAT None  09/02/2023 12:30 PM AC-MH PROVIDER AC-MAT None  09/16/2023 12:30 PM AC-MH PROVIDER AC-MAT None  09/30/2023 12:30 PM AC-MH PROVIDER AC-MAT None  10/14/2023 12:30 PM AC-MH PROVIDER AC-MAT None  10/29/2023 12:30 PM AC-MH PROVIDER AC-MAT None  11/11/2023 12:30 PM AC-MH PROVIDER AC-MAT None   Due to language barrier, a Spanish interpreter ({achd interps:32101}) was present {achd interp options:32100::"in person"} during the history-taking, subsequent discussion, and physical exam with this patient.   [name], [place]

## 2023-07-23 ENCOUNTER — Ambulatory Visit: Admitting: Family Medicine

## 2023-07-23 VITALS — BP 100/63 | HR 88 | Wt 165.0 lb

## 2023-07-23 DIAGNOSIS — Z3A31 31 weeks gestation of pregnancy: Secondary | ICD-10-CM

## 2023-07-23 DIAGNOSIS — Z3483 Encounter for supervision of other normal pregnancy, third trimester: Secondary | ICD-10-CM

## 2023-07-23 DIAGNOSIS — Z3480 Encounter for supervision of other normal pregnancy, unspecified trimester: Secondary | ICD-10-CM

## 2023-07-23 NOTE — Progress Notes (Addendum)
 Counseled 28 week labs were WNL. Verified has [facility] contact card. Ariel Begun, RN 2 week [facility] RV appt scheduled and appt reminder card given. Ariel Begun, RN Client left 2 week [facility] RV appt card in CenteringPregnancy room. Card placed inside tri-folded piece of copier paper / envelope and mailed to client. Ariel Begun, RN

## 2023-08-05 NOTE — Progress Notes (Unsigned)
  [facility] [address] [place] [address] Main phone: [phone]  Prenatal Visit  Subjective:  Deborah Roman is a 18 y.o. G1P0000 at [redacted]w[redacted]d being seen today for ongoing prenatal care.  She is currently monitored for the following issues for this low-risk pregnancy:   Patient Active Problem List   Diagnosis Date Noted   Sibling deceased, Brother died at age 29 due to Leukemia 2023/03/28   Positive GBS test 2023-03-28   Supervision of normal first pregnancy, antepartum 03/13/2023   Late prenatal care 16 weeks 03/13/2023   Patient reports {sx:14538}.   .  .   . Denies leaking of fluid/ROM.   The following portions of the patient's history were reviewed and updated as appropriate: allergies, current medications, past family history, past medical history, past social history, past surgical history and problem list. Problem list updated.  Objective:   There were no vitals filed for this visit.  Fetal Status:           General:  Alert, oriented and cooperative. Patient is in no acute distress.  Skin: Skin is warm and dry. No rash noted.   Cardiovascular: Normal heart rate noted  Respiratory: Normal respiratory effort, no problems with respiration noted  Abdomen: Soft, gravid, appropriate for gestational age.        Pelvic: Cervical exam deferred        Extremities: Normal range of motion.     Mental Status: Normal mood and affect. Normal behavior. Normal judgment and thought content.   Assessment and Plan:  Pregnancy: G1P0000 at [redacted]w[redacted]d  1. [redacted] weeks gestation of pregnancy (Primary) -usually seen in centering group -here for routine RV  2. Supervision of normal first pregnancy, antepartum -seen in ER 1 week ago with abd pain- diagnosed with braxton hicks and BV- treatment given -improved symptoms?/ discharge or odor? Finished meds?   Preterm labor symptoms and general obstetric precautions  including but not limited to vaginal bleeding, contractions, leaking of fluid and fetal movement were reviewed in detail with the patient. Please refer to After Visit Summary for other counseling recommendations.  No follow-ups on file.  Future Appointments  Date Time Provider Department Center  08/06/2023  9:20 AM AC-MH PROVIDER AC-MAT None  08/19/2023 12:30 PM AC-MH PROVIDER AC-MAT None  09/02/2023 12:30 PM AC-MH PROVIDER AC-MAT None  09/16/2023 12:30 PM AC-MH PROVIDER AC-MAT None  09/30/2023 12:30 PM AC-MH PROVIDER AC-MAT None  10/14/2023 12:30 PM AC-MH PROVIDER AC-MAT None  10/29/2023 12:30 PM AC-MH PROVIDER AC-MAT None  11/11/2023 12:30 PM AC-MH PROVIDER AC-MAT None  Due to language barrier, a Spanish interpreter ({achd interps:32101}) was present {achd interp options:32100::"in person"} during the history-taking, subsequent discussion, and physical exam with this patient.    Deborah Roman, [place]

## 2023-08-06 ENCOUNTER — Encounter: Payer: Self-pay | Admitting: Family Medicine

## 2023-08-06 ENCOUNTER — Ambulatory Visit: Admitting: Family Medicine

## 2023-08-06 VITALS — BP 95/61 | HR 82 | Temp 98.4°F | Wt 161.0 lb

## 2023-08-06 DIAGNOSIS — Z3403 Encounter for supervision of normal first pregnancy, third trimester: Secondary | ICD-10-CM

## 2023-08-06 DIAGNOSIS — Z3400 Encounter for supervision of normal first pregnancy, unspecified trimester: Secondary | ICD-10-CM

## 2023-08-06 DIAGNOSIS — Z3A33 33 weeks gestation of pregnancy: Secondary | ICD-10-CM

## 2023-08-06 NOTE — Progress Notes (Signed)
 Here for MH RV at 33 4/7. Centering cycle 2. Rosiland Cooks, RN

## 2023-08-08 ENCOUNTER — Telehealth: Payer: Self-pay

## 2023-08-08 NOTE — Telephone Encounter (Signed)
 Call received from [facility] intake clerk Josepha Nickels stating client having pain and needing to speak to a nurse. Call transferred to [facility]. Client to [facility] for evaluation 07/28/23 and diagnosed with Glover Larve, round ligament pain and BV (Metronidazole prescribed). Kept scheduled 08/06/23 appt in [facility] and counseled to pick up Metronidazole prescription and begin taking (not done). Per client today, continues to have pelvic / stomach pain and pressure all the time and interfering with sleep. States when at [facility] appt 2 days ago, was told by the provider that pain described sounded normal. However, now that pain is constant and more intense per client, she is concerned. Denies leakage of vaginal fluid and endorses fetal movement as usual. Per consult with Dr. Tanis Fan, hospital evaluation needed. Client so counseled and agreeable with plan. Ariel Begun, RN

## 2023-08-09 ENCOUNTER — Ambulatory Visit

## 2023-08-16 ENCOUNTER — Telehealth: Payer: Self-pay | Admitting: Family Medicine

## 2023-08-19 ENCOUNTER — Ambulatory Visit: Admitting: Family Medicine

## 2023-08-19 VITALS — BP 111/66 | HR 99 | Wt 167.4 lb

## 2023-08-19 DIAGNOSIS — Z3400 Encounter for supervision of normal first pregnancy, unspecified trimester: Secondary | ICD-10-CM

## 2023-08-19 DIAGNOSIS — Z3403 Encounter for supervision of normal first pregnancy, third trimester: Secondary | ICD-10-CM

## 2023-08-19 DIAGNOSIS — Z3A35 35 weeks gestation of pregnancy: Secondary | ICD-10-CM

## 2023-08-19 DIAGNOSIS — B951 Streptococcus, group B, as the cause of diseases classified elsewhere: Secondary | ICD-10-CM

## 2023-08-19 NOTE — Progress Notes (Signed)
 Verified has [facility] contact card. Client diagnosed with BV few weeks ago at [facility] emergent visit. Declined to pick up prescribed Metronidazole because doing okay. 1 week MHC RV appt scheduled and reminder card given. Burnadette Lowers, RN

## 2023-08-19 NOTE — Progress Notes (Signed)
  [facility] [address] [place] [address] Main phone: [phone]  Prenatal Visit  Subjective:  Deborah Roman is a 18 y.o. G1P0000 at [redacted]w[redacted]d being seen today for ongoing prenatal care.  She is currently monitored for the following issues for this low-risk pregnancy:   Patient Active Problem List   Diagnosis Date Noted   Sibling deceased, Brother died at age 57 due to Leukemia Apr 08, 2023   Positive GBS test April 08, 2023   Supervision of normal first pregnancy, antepartum 03/13/2023   Late prenatal care 16 weeks 03/13/2023   Patient reports pelvic Roman with walking.  Contractions: Not present. Vag. Bleeding: None.  Movement: Present. Denies leaking of fluid/ROM.   The following portions of the patient's history were reviewed and updated as appropriate: allergies, current medications, past family history, past medical history, past social history, past surgical history and problem list. Problem list updated.  Objective:   Vitals:   08/19/23 1340  BP: 111/66  Pulse: 99  Weight: 167 lb 6.4 oz (75.9 kg)   Fetal Status: Fetal Heart Rate (bpm): 140 Fundal Height: 35 cm Movement: Present     General:  Alert, oriented and cooperative. Patient is in no acute distress.  Skin: Skin is warm and dry. No rash noted.   Cardiovascular: Normal heart rate noted  Respiratory: Normal respiratory effort, no problems with respiration noted  Abdomen: Soft, gravid, appropriate for gestational age.  Roman/Pressure: Present     Pelvic: Cervical exam deferred        Extremities: Normal range of motion.     Mental Status: Normal mood and affect. Normal behavior. Normal judgment and thought content.   Assessment and Plan:  Pregnancy: G1P0000 at [redacted]w[redacted]d  [redacted] weeks gestation of pregnancy  Supervision of normal first pregnancy, antepartum Assessment & Plan: Doing well. Reports a LOT of fetal movement. Also pelvic Roman,  especially with walking. Discussed belly support band  and offered a referral to PT; patient politely declines PT referral. BP normal. TWG 35 lb 6.4 oz (16.1 kg). Taking prenatal vitamin daily and aspirin  81 mg daily. Next prenatal appointment in 1 weeks.     Positive GBS test Assessment & Plan: Discussed with patient today. No GBS swab needed next week.    Centering Pregnancy, Session#4: Reviewed resources in [employer].   Facilitated discussion today:  Family dynamics/environment, family planning postpartum, and preterm labor Mindfulness activity completed as well as deep breathing with hand massage for childbirth preparation.    Fundal height and FHR appropriate today unless noted otherwise in plan. Patient to continue group care.    Preterm labor symptoms and general obstetric precautions including but not limited to vaginal bleeding, contractions, leaking of fluid and fetal movement were reviewed in detail with the patient. Please refer to After Visit Summary for other counseling recommendations.  No follow-ups on file.  Future Appointments  Date Time Provider Department Center  08/27/2023 10:00 AM AC-MH PROVIDER AC-MAT None  09/02/2023 12:30 PM AC-MH PROVIDER AC-MAT None  09/16/2023 12:30 PM AC-MH PROVIDER AC-MAT None  09/30/2023 12:30 PM AC-MH PROVIDER AC-MAT None  10/14/2023 12:30 PM AC-MH PROVIDER AC-MAT None  10/29/2023 12:30 PM AC-MH PROVIDER AC-MAT None  11/11/2023 12:30 PM AC-MH PROVIDER AC-MAT None    Betsey CHRISTELLA Helling, MD

## 2023-08-19 NOTE — Assessment & Plan Note (Signed)
 Discussed with patient today. No GBS swab needed next week.

## 2023-08-19 NOTE — Patient Instructions (Signed)
 I translated the following text using Google translate.  Please excuse any errors.  He traducido el siguiente texto con el traductor de Microbiologist. Disculpe cualquier error.  Pregnancy Continue taking your prenatal vitamin daily and aspirin 81 mg daily.  Please schedule your next prenatal visit for about 1 weeks from now.  Please visit WIC (women's, infants, and children program) to see about their breast feeding peer support program. You can start this program to get tips and tricks for successful breast feeding of your baby.  Embarazo Contine tomando sus vitamina prenatal diaria and aspirina 81 mg al [employer]. Programe su prxima visita prenatal para dentro de aproximadamente 1 [address]. Visita WIC (programa para mujeres, bebs y nios) para Solicitor su programa de apoyo entre pares para la Tour manager. Puedes iniciar este programa para obtener consejos y trucos para una lactancia exitosa de tu beb.  Prenatal Classes If delivering at [facility] with [facility] or [facility]: Go to [email or web address]   If delivering at [facility]: Go to https://www.[facility].[email or web address]/ and search for "Pregnancy and Parenting Classes" "Prepared Childbirth Classes" Clases prenatales Si el parto se realizar en [facility] con el OB de la [facility] o en [facility]: Visite [email or web address]  Si el parto se realizar en la [facility]: visite [email or web address].[facility].[email or web address]/ y busque: "Clases de embarazo y crianza" "Clases de preparacin para el parto"  Resource regarding exposures that could affect pregnancy: Mother To Pecola Leisure is a Dentist with lots of information on the effects of many medications and exposures on your pregnancy. It is free to use, including their web site, phone line, text service, app, or email and live chat.  http://golden-[email or web address]/ Email or live  chat: MotherToBaby.org Phone: ([phone] Text: [phone] App: search "LactRx"  Recursos sobre exposiciones que podran afectar el embarazo: Mother To Baby es una organizacin sin fines de Visual merchandiser con mucha informacin [employer]ers efectos de muchos medicamentos y exposiciones en Firefighter. Su uso es gratuito, incluido su sitio web, Art therapist, servicio de mensajes de texto, aplicacin o correo electrnico y chat en vivo. CDApps.pl Correo electrnico o chat en vivo: MotherToBaby.org Telfono: [phone] Mensaje de texto: [phone] Aplicacin: busque "LactRx"  Maternal Mental Health If you start to develop the below symptoms of depression, please reach out to [place] for an appointment. There is also a Biomedical scientist Health Hotline at [phone]). This hotline has trained counselors, doulas, and midwifes to real-time support, information, and resources.  Feeling sad or hopeless most of the time Lack of interest in things you used to enjoy Less interest in caring for yourself (dressing, fixing hair) Trouble concentrating Trouble coping with daily tasks Constant worry about your baby Sleeping or eating too much or too little Feeling very anxious or nervous Unexplained irritability or anger Unwanted or scary thoughts Feeling that you are not a good mother Thoughts of hurting yourself or your baby If you feel you are experiencing a mental health crisis, please reach out to the National Suicide Prevention Hotline at 1-800-273-TALK 661-846-4725).  Salud Mental Materna Si comienza a Environmental education officer los siguientes sntomas de depresin, comunquese con nosotros para programar una cita. Tambin hay una lnea directa nacional de salud mental materna en el 1-833-9-HELP4MOMS [phone]). Esta lnea directa ha capacitado a consejeros, doulas y parteras para brindar apoyo, informacin y Occupational hygienist real.  Sentirse  triste o sin esperanza la mayor parte del tiempo  Falta de inters en las cosas que sola disfrutar  Menos inters en cuidarse  a s mismo (vestirse, arreglarse el cabello)  Problemas para concentrarse  Problemas para hacer frente a las tareas diarias  Preocupacin constante por su beb  Dormir o comer demasiado o muy poco  Sentirse muy ansioso o nervioso  Irritabilidad o ira inexplicables  Pensamientos no deseados o Insurance underwriter que no eres una buena madre  Pensamientos de Runner, broadcasting/film/video a s Visual merchandiser o a su beb Si cree que est experimentando una crisis de salud mental, comunquese con la [employer] de Prevencin del Suicidio al 1-800-273-TALK [phone]).

## 2023-08-19 NOTE — Assessment & Plan Note (Signed)
 Doing well. Reports a LOT of fetal movement. Also pelvic pain, especially with walking. Discussed belly support band  and offered a referral to PT; patient politely declines PT referral. BP normal. TWG 35 lb 6.4 oz (16.1 kg). Taking prenatal vitamin daily and aspirin  81 mg daily. Next prenatal appointment in 1 weeks.

## 2023-08-22 NOTE — Nursing Note (Signed)
 Discharge instructions reviewed with patient via spanish interpreter [place], [facility] interpreter); patient verbalizes understanding of discharge instructions. Discharge home with self care as discussed with Dr. Losapio, [facility] Resident.

## 2023-08-27 ENCOUNTER — Ambulatory Visit: Admitting: Nurse Practitioner

## 2023-08-27 ENCOUNTER — Encounter: Payer: Self-pay | Admitting: Nurse Practitioner

## 2023-08-27 VITALS — BP 88/53 | HR 81 | Temp 97.4°F | Wt 165.2 lb

## 2023-08-27 DIAGNOSIS — B951 Streptococcus, group B, as the cause of diseases classified elsewhere: Secondary | ICD-10-CM

## 2023-08-27 DIAGNOSIS — Z3689 Encounter for other specified antenatal screening: Secondary | ICD-10-CM

## 2023-08-27 DIAGNOSIS — Z3403 Encounter for supervision of normal first pregnancy, third trimester: Secondary | ICD-10-CM

## 2023-08-27 DIAGNOSIS — Z3A36 36 weeks gestation of pregnancy: Secondary | ICD-10-CM

## 2023-08-27 DIAGNOSIS — Z3400 Encounter for supervision of normal first pregnancy, unspecified trimester: Secondary | ICD-10-CM

## 2023-08-27 NOTE — Progress Notes (Addendum)
 Declines self-collection of 36 week gonorrhea / chlamydia culture. GBS culture not needed due to hx of GBS UTI this pregnancy. Burnadette Lowers, RN NST initiated per verbal order of Clarita Narrow FNP-C. Burnadette Lowers, RN NST x30 minutes and discontinued after review by Clarita Narrow FNP-C. Nst tracing not visible in EPIC and reported to Rollo Simpler RN, Nursing Supervisor with follow-up by Daria Faith. Burnadette Lowers, RN

## 2023-08-27 NOTE — Progress Notes (Signed)
 [facility] [address] [place] [address] Main phone: [phone]  Prenatal Visit  Subjective:  Deborah Roman is a 18 y.o. G1P0000 at [redacted]w[redacted]d being seen today for ongoing prenatal care.  She is currently monitored for the following issues for this low-risk pregnancy:   Patient Active Problem List   Diagnosis Date Noted   Sibling deceased, Brother died at age 73 due to Leukemia 04/03/2023   Positive GBS test 2023-04-03   Supervision of normal first pregnancy, antepartum 03/13/2023   Late prenatal care 16 weeks 03/13/2023   Patient reports cramping and decreased fetal movement 3 times per day.  Contractions: Regular (cramping lasting 10 minutes, goes away for 20 minutes, returns for 10 minutes, and continues in that pattern constantly.). Vag. Bleeding: None.  Movement: (!) Decreased (Per patient 3 times per day). Denies leaking of fluid/ROM.   The following portions of the patient's history were reviewed and updated as appropriate: allergies, current medications, past family history, past medical history, past social history, past surgical history and problem list. Problem list updated.  Objective:   Vitals:   08/27/23 1005  BP: (!) 88/53  Pulse: 81  Temp: (!) 97.4 F (36.3 C)  Weight: 165 lb 3.2 oz (74.9 kg)    Fetal Status: Fetal Heart Rate (bpm): 136 Fundal Height: 34 cm Movement: (!) Decreased (Per patient 3 times per day)     General:  Alert, oriented and cooperative. Patient is in no acute distress.  Skin: Skin is warm and dry. No rash noted.   Cardiovascular: Normal heart rate noted  Respiratory: Normal respiratory effort, no problems with respiration noted  Abdomen: Soft, gravid, appropriate for gestational age.  Pain/Pressure: Present     Pelvic: Cervical exam deferred        Extremities: Normal range of motion.  Edema: None  Mental Status: Normal mood and affect. Normal behavior.  Normal judgment and thought content.   Assessment and Plan:  Pregnancy: G1P0000 at [redacted]w[redacted]d  1. [redacted] weeks gestation of pregnancy (Primary) Patient to return in 1 week. Has CP scheduled. Patient aware.  Obtaining 36 week gonorrhea/chlamydia vaginal swab today.   2. Supervision of normal first pregnancy, antepartum Reports taking PNV and ASA daily.   Reports concern of constant lower abdominal cramping that radiates to lower back, that occurs for 10 minutes, is relieved for 20 minutes, and then returns for 10 minutes. Reports this pattern is constant daily.  Reports decreased fetal movement of 3 times per day. Obtaining NST in office today.   Of note, patient seen in [facility] ED 6/26 with same concerns of cramping and decreased fetal movement. NST in ED reactive and patient discharged. Reports today she is still having the same symptoms as when seen in ED and they have not changed, not gotten better/worse.    BP=88/53 in office today. Patient denies dizziness, pre-syncope, or syncope. Patient reports 1 (16.9oz) bottle of water per day. BP and cramping, decreased fetal movement symptoms indicative of dehydration. Advised patient 1 bottle of water is not enough per day and she should be getting at least 5 (16.9oz) bottles of water per day.   PHQ-2=2, negative in office today. Reports a 1 each to difficulty sleeping and feeling tired. This is most likely related to patient reports of difficulty getting comfortable at night causing difficulty sleeping.   - Chlamydia/GC NAA, Confirmation  3. Positive GBS test Not obtianing GBS swab in office today as patient  was previously positive on urine culture.    4. NST (non-stress test) reactive on fetal surveillance Category 1 (8) 15X15 accels No decels Moderate variability Baseline 135bpm Reassuring.  Educated patient on adequate water hydration. Educated patient on how to determine fetal movement and what qualifies as fetal movement, as well as how to  count fetal movement. Discussed what to do if patient thinks she is feeling baby less than 10 times per day, but that it is of upmost importance to count movements and that less than 10 per day is a reason to go to the hospital.   Educated patient on preterm labor and when to present to ED. Patient verbalized understanding.    Preterm labor symptoms and general obstetric precautions including but not limited to vaginal bleeding, contractions, leaking of fluid and fetal movement were reviewed in detail with the patient. Please refer to After Visit Summary for other counseling recommendations.  Return in about 1 week (around 09/03/2023) for 37 week prenatal care.  Future Appointments  Date Time Provider Department Center  09/02/2023 12:30 PM AC-MH PROVIDER AC-MAT None  09/16/2023 12:30 PM AC-MH PROVIDER AC-MAT None  09/30/2023 12:30 PM AC-MH PROVIDER AC-MAT None  10/14/2023 12:30 PM AC-MH PROVIDER AC-MAT None  10/29/2023 12:30 PM AC-MH PROVIDER AC-MAT None  11/11/2023 12:30 PM AC-MH PROVIDER AC-MAT None   Patient declined interpreter.   Clarita LITTIE Narrow, NP

## 2023-08-29 ENCOUNTER — Ambulatory Visit: Payer: Self-pay | Admitting: Family Medicine

## 2023-08-29 ENCOUNTER — Telehealth: Payer: Self-pay | Admitting: Family Medicine

## 2023-08-29 LAB — CHLAMYDIA/GC NAA, CONFIRMATION
Chlamydia trachomatis, NAA: NEGATIVE
Neisseria gonorrhoeae, NAA: NEGATIVE

## 2023-08-29 NOTE — Progress Notes (Signed)
 Negative G/C.   Dorothyann Helling, MD 08/29/23  3:46 PM

## 2023-09-02 ENCOUNTER — Ambulatory Visit

## 2023-09-02 NOTE — Telephone Encounter (Signed)
 Call to client and notified CenteringPregnancy cancelled today. MHC RV appt scheduled for tomorrow. Burnadette Lowers, RN

## 2023-09-03 ENCOUNTER — Ambulatory Visit: Admitting: Nurse Practitioner

## 2023-09-03 VITALS — BP 104/62 | HR 78 | Temp 97.4°F | Wt 171.4 lb

## 2023-09-03 DIAGNOSIS — Z3403 Encounter for supervision of normal first pregnancy, third trimester: Secondary | ICD-10-CM

## 2023-09-03 DIAGNOSIS — Z3400 Encounter for supervision of normal first pregnancy, unspecified trimester: Secondary | ICD-10-CM

## 2023-09-03 DIAGNOSIS — Z3689 Encounter for other specified antenatal screening: Secondary | ICD-10-CM

## 2023-09-03 DIAGNOSIS — O26843 Uterine size-date discrepancy, third trimester: Secondary | ICD-10-CM | POA: Diagnosis not present

## 2023-09-03 DIAGNOSIS — Z3A37 37 weeks gestation of pregnancy: Secondary | ICD-10-CM | POA: Diagnosis not present

## 2023-09-03 NOTE — Progress Notes (Signed)
 Counseled 08/27/23 gonorrhea / chlamydia culture was negarive. Verified has [facility] contact card. 1 week MHC RV appt scheduled and will have CenteringPregnancy appt 09/16/23. Burnadette Lowers, RN

## 2023-09-04 ENCOUNTER — Encounter: Payer: Self-pay | Admitting: Nurse Practitioner

## 2023-09-04 DIAGNOSIS — Z3689 Encounter for other specified antenatal screening: Secondary | ICD-10-CM | POA: Insufficient documentation

## 2023-09-04 DIAGNOSIS — O26843 Uterine size-date discrepancy, third trimester: Secondary | ICD-10-CM | POA: Insufficient documentation

## 2023-09-04 HISTORY — DX: Uterine size-date discrepancy, third trimester: O26.843

## 2023-09-04 HISTORY — DX: Encounter for other specified antenatal screening: Z36.89

## 2023-09-04 NOTE — Progress Notes (Signed)
 [facility] [address] [place] [address] Main phone: [phone]  Prenatal Visit  Subjective:  Deborah Roman is a 18 y.o. G1P0000 at [redacted]w[redacted]d being seen today for ongoing prenatal care.  She is currently monitored for the following issues for this low-risk pregnancy:   Patient Active Problem List   Diagnosis Date Noted   Sibling deceased, Brother died at age 61 due to Leukemia March 31, 2023   Positive GBS test Mar 31, 2023   Supervision of normal first pregnancy, antepartum 03/13/2023   Late prenatal care 16 weeks 03/13/2023   Patient reports fatigue.  Contractions: Irregular. Vag. Bleeding: None.  Movement: (!) Decreased. Denies leaking of fluid/ROM.   The following portions of the patient's history were reviewed and updated as appropriate: allergies, current medications, past family history, past medical history, past social history, past surgical history and problem list. Problem list updated.  Objective:   Vitals:   09/03/23 1026  BP: 104/62  Pulse: 78  Temp: (!) 97.4 F (36.3 C)  Weight: 171 lb 6.4 oz (77.7 kg)    Fetal Status: Fetal Heart Rate (bpm): 136 Fundal Height: 32 cm Movement: (!) Decreased  Presentation: Vertex  General:  Alert, oriented and cooperative. Patient is in no acute distress.  Skin: Skin is warm and dry. No rash noted.   Cardiovascular: Normal heart rate noted  Respiratory: Normal respiratory effort, no problems with respiration noted  Abdomen: Soft, gravid, appropriate for gestational age.  Pain/Pressure: Present     Pelvic: Cervical exam deferred        Extremities: Normal range of motion.  Edema: Trace  Mental Status: Normal mood and affect. Normal behavior. Normal judgment and thought content.   Assessment and Plan:  Pregnancy: G1P0000 at [redacted]w[redacted]d  1. [redacted] weeks gestation of pregnancy (Primary) RV in 1 week.   2. Supervision of normal first pregnancy,  antepartum Taking PNV and ASA daily.   BP = 104/752 and reassuring. Denies s/s of Pre-E.  Reports still having abdominal cramping and lower back pain as she did 1 week ago. Encouraged to increase water intake at last visit, which she reports doing. Now about 4 bottles per day.   Reports she is tired but denies sadness or depression. Denies Alan Hail, LCSW services today.   Total Weight Gain: 39 lb 6.4 oz (17.9 kg)   3. NST (non-stress test) reactive on fetal surveillance NST in office today, d/t patient report of decreased fetal movement.  Results: Category 1-Reactive Baseline 135 Moderate variability 8 15X15 accels No decels  4. Uterine size-date discrepancy in third trimester Patient measuring 32cm fundal height today at [redacted] weeks gestation. This is a new finding as patient has been at most 2cm off compared to dates. Most recently, 1 week ago patient measured 34cm at 36 weeks.  This could be an indication baby has dropped into the pelvis in preparation for delivery.   Since the discrepancy is significant plan to refer for an US .   Term labor symptoms and general obstetric precautions including but not limited to vaginal bleeding, contractions, leaking of fluid and fetal movement were reviewed in detail with the patient.  Please refer to After Visit Summary for other counseling recommendations.   Return in about 1 week (around 09/10/2023) for 38 week prenatal care.  Future Appointments  Date Time Provider Department Center  09/10/2023  9:00 AM AC-MH PROVIDER AC-MAT None  09/16/2023 12:30 PM AC-MH PROVIDER AC-MAT None  09/30/2023 12:30 PM  AC-MH PROVIDER AC-MAT None  10/14/2023 12:30 PM AC-MH PROVIDER AC-MAT None  10/29/2023 12:30 PM AC-MH PROVIDER AC-MAT None  11/11/2023 12:30 PM AC-MH PROVIDER AC-MAT None   Declined interpreter.  Clarita LITTIE Narrow, NP

## 2023-09-10 ENCOUNTER — Ambulatory Visit: Admitting: Physician Assistant

## 2023-09-10 ENCOUNTER — Encounter: Payer: Self-pay | Admitting: Physician Assistant

## 2023-09-10 VITALS — BP 103/63 | HR 79 | Temp 97.6°F | Wt 173.0 lb

## 2023-09-10 DIAGNOSIS — Z3A38 38 weeks gestation of pregnancy: Secondary | ICD-10-CM

## 2023-09-10 DIAGNOSIS — Z3400 Encounter for supervision of normal first pregnancy, unspecified trimester: Secondary | ICD-10-CM

## 2023-09-10 DIAGNOSIS — O26843 Uterine size-date discrepancy, third trimester: Secondary | ICD-10-CM

## 2023-09-10 DIAGNOSIS — Z3403 Encounter for supervision of normal first pregnancy, third trimester: Secondary | ICD-10-CM

## 2023-09-10 NOTE — Progress Notes (Signed)
Patient here for MH RV at 38 4/7. .Katie Brewer-Jensen, RN  

## 2023-09-10 NOTE — Progress Notes (Signed)
  [facility] [address] [place] [address] Main phone: [phone]  Prenatal Visit  Subjective:  Deborah Roman is a 18 y.o. G1P0000 at [redacted]w[redacted]d being seen today for ongoing prenatal care.  She is currently monitored for the following issues for this low-risk pregnancy:   Patient Active Problem List   Diagnosis Date Noted   Uterine size-date discrepancy in third trimester 09/13/2023   NST (non-stress test) reactive on fetal surveillance 13-Sep-2023   Sibling deceased, Brother died at age 39 due to Leukemia 2023/03/29   Positive GBS test March 29, 2023   Supervision of normal first pregnancy, antepartum 03/13/2023   Late prenatal care 16 weeks 03/13/2023   Patient reports occasional contractions.  Contractions: Irritability. Vag. Bleeding: None.  Movement: Present. Feels like Braxton-Hicks, occasional, mild. Denies leaking of fluid/ROM.   The following portions of the patient's history were reviewed and updated as appropriate: allergies, current medications, past family history, past medical history, past social history, past surgical history and problem list. Problem list updated.  Objective:   Vitals:   09/10/23 0909  BP: 103/63  Pulse: 79  Temp: 97.6 F (36.4 C)  Weight: 173 lb (78.5 kg)    Fetal Status: Fetal Heart Rate (bpm): 136 Fundal Height: 35 cm Movement: Present  Presentation: Vertex  General:  Alert, oriented and cooperative. Patient is in no acute distress.  Skin: Skin is warm and dry. No rash noted.   Cardiovascular: Normal heart rate noted  Respiratory: Normal respiratory effort, no problems with respiration noted  Abdomen: Soft, gravid, appropriate for gestational age.  Pain/Pressure: Absent     Pelvic: Cervical exam deferred        Extremities: Normal range of motion.  Edema: None  Mental Status: Normal mood and affect. Normal behavior. Normal judgment and thought content.    Assessment and Plan:  Pregnancy: G1P0000 at [redacted]w[redacted]d  1. [redacted] weeks gestation of pregnancy (Primary) RV as planned for next [facility] Preg visit next week.  2. Supervision of normal first pregnancy, antepartum Continue aspirin  and prenatal vitamins. Enc to drink plenty of water in light of very hot weather. Reviewed neg GC/Chlam tet results from prior visit.  3. Uterine size-date discrepancy in third trimester Size < dates. Enc to keep [facility]  as sched tomorrow. Pt states understanding and plans to do so.   Term labor symptoms and general obstetric precautions including but not limited to vaginal bleeding, contractions, leaking of fluid and fetal movement were reviewed in detail with the patient. Please refer to After Visit Summary for other counseling recommendations.  No follow-ups on file.  Future Appointments  Date Time Provider Department Center  09/16/2023 12:30 PM [facility] PROVIDER [facility] None  09/30/2023 12:30 PM [facility] PROVIDER [facility] None  10/14/2023 12:30 PM [facility] PROVIDER [facility] None  10/29/2023 12:30 PM [facility] PROVIDER [facility] None  11/11/2023 12:30 PM [facility] PROVIDER [facility] None    Saul Irvine, PA-C

## 2023-09-12 DIAGNOSIS — O0993 Supervision of high risk pregnancy, unspecified, third trimester: Secondary | ICD-10-CM | POA: Diagnosis not present

## 2023-09-13 ENCOUNTER — Telehealth: Payer: Self-pay | Admitting: Family Medicine

## 2023-09-13 NOTE — Consults (Signed)
 Patient seen for consult, see Lactation Note  Chelsea Redo, RN, BSN, [facility] 09/13/23. 2:17 AM

## 2023-09-16 ENCOUNTER — Ambulatory Visit

## 2023-09-30 ENCOUNTER — Ambulatory Visit

## 2023-10-14 ENCOUNTER — Ambulatory Visit

## 2023-10-29 ENCOUNTER — Ambulatory Visit

## 2023-11-11 ENCOUNTER — Ambulatory Visit

## 2023-12-10 ENCOUNTER — Ambulatory Visit

## 2023-12-10 VITALS — BP 100/61 | HR 65 | Ht 61.0 in | Wt 147.4 lb

## 2023-12-10 DIAGNOSIS — Z30017 Encounter for initial prescription of implantable subdermal contraceptive: Secondary | ICD-10-CM | POA: Diagnosis not present

## 2023-12-10 DIAGNOSIS — Z309 Encounter for contraceptive management, unspecified: Secondary | ICD-10-CM | POA: Diagnosis not present

## 2023-12-10 DIAGNOSIS — Z3202 Encounter for pregnancy test, result negative: Secondary | ICD-10-CM | POA: Diagnosis not present

## 2023-12-10 DIAGNOSIS — Z0000 Encounter for general adult medical examination without abnormal findings: Secondary | ICD-10-CM

## 2023-12-10 DIAGNOSIS — Z3040 Encounter for surveillance of contraceptives, unspecified: Secondary | ICD-10-CM

## 2023-12-10 LAB — PREGNANCY, URINE: Preg Test, Ur: NEGATIVE

## 2023-12-10 MED ORDER — ETONOGESTREL 68 MG ~~LOC~~ IMPL
68.0000 mg | DRUG_IMPLANT | Freq: Once | SUBCUTANEOUS | Status: AC
  Administered 2023-12-10: 68 mg via SUBCUTANEOUS

## 2023-12-10 NOTE — Addendum Note (Signed)
 Addended by: Kennetta Pavlovic on: 12/10/2023 12:33 PM   Modules accepted: Orders

## 2023-12-10 NOTE — Progress Notes (Signed)
 [facility] [address] [place] [address] Main phone: [phone]  Postpartum Visit  Subjective:  Deborah Roman is a 18 y.o. G67P1001 female who presents for a postpartum visit. She is 3 months postpartum following a normal spontaneous vaginal delivery.  I have fully reviewed the prenatal and intrapartum course. Postpartum course has been good.   Patient Active Problem List   Diagnosis Date Noted   Sibling deceased, Brother died at age 53 due to Leukemia March 29, 2023   Delivery The delivery was at 30 gestational weeks.   Anesthesia: epidural.  Delivery complications: none Patient describes her labor and delivery as good.   Infant Baby is doing well.  Baby is feeding by breast.  GI & GU Patient had a 1st degree laceration at delivery.  Bleeding: no bleeding.  Bowel function is normal.  Bladder function is normal.  Patient has urinary incontinence? No  Sexuality and Contraception Patient is sexually active.  Contraception method is condoms.   The pregnancy intention screening data noted above was reviewed. Potential methods of contraception were discussed. The patient elected to proceed with No data recorded.  Mood Postpartum depression screening: negative. Flowsheet Row Routine Prenatal from 08/27/2023 in [facility] Department  PHQ-9 Total Score 2    Health Maintenance Health Maintenance Due  Topic Date Due   Hepatitis B Vaccines 19-59 Average Risk (3 of 3 - 3-dose series) 01/11/2017   HPV VACCINES (1 - 3-dose series) Never done   Meningococcal B Vaccine (1 of 2 - Standard) Never done   Influenza Vaccine  09/27/2023   COVID-19 Vaccine (4 - 2025-26 season) 10/28/2023   Review of Systems Pertinent items are noted in HPI.  Objective:  BP 100/61 (BP Location: Right Arm, Patient Position: Sitting, Cuff Size: Normal)   Pulse 65   Ht 5' 1 (1.549 m)   Wt 147 lb 6.4 oz  (66.9 kg)   LMP 10/17/2023   Breastfeeding Yes   BMI 27.85 kg/m    General:  alert, cooperative, appears stated age, and fatigued   Breasts:  not indicated  Lungs: clear to auscultation bilaterally  Heart:  regular rate and rhythm, S1, S2 normal, no murmur, click, rub or gallop  Abdomen: soft, non-tender; bowel sounds normal; no masses,  no organomegaly   Wound N/a  GU exam:  not indicated       Assessment and Plan:   1. Well woman exam (no gynecological exam) (Primary)   2. Nexplanon insertion  - Pregnancy, urine negative  Procedure:  Nexplanon Insertion  Patient identified, informed consent performed, consent signed.   Patient does understand that irregular bleeding is a very common side effect of this medication. She was advised to have backup contraception after placement. Patient was determined to meet WHO criteria for not being pregnant. Appropriate time out taken.  The insertion site was identified 8-10 cm (3-4 inches) from the medial epicondyle of the humerus and 3-5 cm (1.25-2 inches) posterior to (below) the sulcus (groove) between the biceps and triceps muscles of the patient's left arm and marked.  Patient was prepped with alcohol swab and then injected with 3 ml of 1% lidocaine .  Arm was prepped with chlorhexidene, Nexplanon removed from packaging,  Device confirmed in needle, then inserted full length of needle and withdrawn per handbook instructions. Nexplanon was able to palpated in the patient's arm; patient palpated the insert herself. There was minimal blood loss.  Patient insertion site covered  with guaze and a pressure bandage to reduce any bruising.  The patient tolerated the procedure well and was given post procedure instructions.    Nexplanon:   Counseled patient to take OTC analgesic starting as soon as lidocaine  starts to wear off and take regularly for at least 48 hr to decrease discomfort.  Specifically to take with food or milk to decrease stomach upset and  for IB 600 mg (3 tablets) every 6 hrs; IB 800 mg (4 tablets) every 8 hrs; or Aleve 2 tablets every 12 hrs.    Essential components of care per ACOG recommendations:  1.  Mood and well being: Patient with negative depression screening today. Reviewed local resources for support.  - Patient tobacco use? No.   - hx of drug use? No.    2. Infant care and feeding:  -Patient currently breastmilk feeding? Yes. Reviewed importance of draining breast regularly to support lactation.  -Social determinants of health (SDOH) reviewed in EPIC. No concerns.  3. Sexuality, contraception and birth spacing - Patient does not want a pregnancy in the next year. - Reviewed reproductive life planning. Reviewed options based on patient desire and reproductive life plan. Patient is interested in Hormonal Implant. This was provided to the patient today.  Risks, benefits, and typical effectiveness rates were reviewed.  Questions were answered.  Written information was also given to the patient to review.    The patient will follow up in  1 years for surveillance.  The patient was told to call with any further questions, or with any concerns about this method of contraception.  Emphasized use of condoms 100% of the time for STI prevention.  Emergency Contraception Precautions (ECP): Patient assessed for need of ECP. She is not a candidate based on consistent use of condoms. - Discussed birth spacing of 18 months  4. Sleep and fatigue -Encouraged family/partner/community support of 4 hrs of uninterrupted sleep to help with mood and fatigue  5. Physical Recovery  - Discussed patients delivery and complications. She describes her labor as good. - Perineal healing reviewed. Patient expressed understanding - Patient is safe to resume physical and sexual activity. She has already resumed both. No pain with sex or difficulty with physical activity.  6.  Health Maintenance - Last pap smear: none, initiate screening at  19 - Pap smear not done at today's visit.  - Breast Cancer screening indicated? No.   7. Chronic Disease/Pregnancy Condition follow up: None - PCP follow up  Damien FORBES Satchel, NP [facility] Department
